# Patient Record
Sex: Male | Born: 1980 | Race: White | Hispanic: No | Marital: Single | Smoking: Never smoker
Health system: Southern US, Community
[De-identification: ages and names within clinical notes are randomized; demographics above are authoritative.]

---

## 2016-02-06 ENCOUNTER — Encounter: Payer: Self-pay | Admitting: Emergency Medicine

## 2016-02-06 ENCOUNTER — Ambulatory Visit
Admission: EM | Admit: 2016-02-06 | Discharge: 2016-02-06 | Disposition: A | Discharge status: Home / Self Care | Payer: BLUE CROSS/BLUE SHIELD | Attending: Family Medicine | Admitting: Family Medicine

## 2016-02-06 DIAGNOSIS — Z23 Encounter for immunization: Secondary | ICD-10-CM

## 2016-02-06 DIAGNOSIS — J0110 Acute frontal sinusitis, unspecified: Secondary | ICD-10-CM | POA: Diagnosis not present

## 2016-02-06 DIAGNOSIS — J40 Bronchitis, not specified as acute or chronic: Secondary | ICD-10-CM

## 2016-02-06 DIAGNOSIS — J0100 Acute maxillary sinusitis, unspecified: Secondary | ICD-10-CM

## 2016-02-06 LAB — RAPID STREP SCREEN (MED CTR MEBANE ONLY): Streptococcus, Group A Screen (Direct): NEGATIVE

## 2016-02-06 MED ORDER — TETANUS-DIPHTH-ACELL PERTUSSIS 5-2.5-18.5 LF-MCG/0.5 IM SUSP
0.5000 mL | Freq: Once | INTRAMUSCULAR | Status: AC
  Administered 2016-02-06: 0.5 mL via INTRAMUSCULAR

## 2016-02-06 MED ORDER — AMOXICILLIN-POT CLAVULANATE 875-125 MG PO TABS: 1.0000 | ORAL_TABLET | Freq: Two times a day (BID) | ORAL | Status: DC

## 2016-02-06 MED ORDER — BENZONATATE 100 MG PO CAPS: 100.0000 mg | ORAL_CAPSULE | Freq: Three times a day (TID) | ORAL | Status: DC | PRN

## 2016-02-06 MED ORDER — GUAIFENESIN-CODEINE 100-10 MG/5ML PO SOLN: 5.0000 mL | Freq: Three times a day (TID) | ORAL | Status: DC | PRN

## 2016-02-06 NOTE — ED Provider Notes (Signed)
[facility]  ____________________________________________  Time seen: Approximately 1:22 PM  I have reviewed the triage vital signs and the nursing notes.   HISTORY  Chief Complaint Cough  HPI Mayfield Schoene is a 35 y.o. male presents for complaint of 2 weeks of runny nose, nasal congestion, sinus pressure and sinus drainage. Reports cough is primarily associated with postnasal drainage at night. Reports occasional cough throughout the day. Patient reports that nasal drainage is primarily thick green or yellow coloration. States current sinus discomfort is 5 out of 10 aching and pressure. Denies pain radiation. Reports symptoms have resolved with over-the-counter cough and congestion medications.  Denies chest pain, shortness of breath, abdominal pain, neck or back pain, rash, dizziness or weakness.    No past medical history on file.  There are no active problems to display for this patient.   No past surgical history on file.  No current outpatient prescriptions on file.  Allergies Review of patient's allergies indicates no known allergies.  History reviewed. No pertinent family history.  Social History Social History  Substance Use Topics  . Smoking status: Never Smoker   . Smokeless tobacco: None  . Alcohol Use: No    Review of Systems Constitutional: No fever/chills Eyes: No visual changes. ENT: No sore throat. Positive cough, congestion, runny nose.  Cardiovascular: Denies chest pain. Respiratory: Denies shortness of breath. Gastrointestinal: No abdominal pain.  No nausea, no vomiting.  No diarrhea.  No constipation. Genitourinary: Negative for dysuria. Musculoskeletal: Negative for back pain. Skin: Negative for rash. Neurological: Negative for headaches, focal weakness or numbness.  10-point ROS otherwise negative.  ____________________________________________   PHYSICAL EXAM:  VITAL SIGNS: ED Triage Vitals  Enc Vitals Group     BP  02/06/16 1256 124/73 mmHg     Pulse Rate 02/06/16 1255 67     Resp 02/06/16 1255 16     Temp 02/06/16 1255 98.2 F (36.8 C)     Temp src -- oral     SpO2 02/06/16 1255 99 %     Weight 02/06/16 1255 219 lb (99.338 kg)     Height 02/06/16 1255  (1.778 m)     Head Cir --      Peak Flow --      Pain Score 02/06/16 1257 2     Pain Loc --      Pain Edu? --      Excl. in [place]? --   Constitutional: Alert and oriented. Well appearing and in no acute distress. Eyes: Conjunctivae are normal. PERRL. EOMI. Head: Atraumatic.Mild to moderate tenderness to palpation bilateral frontal and maxillary sinuses. No swelling. No erythema.   Ears: no erythema, normal TMs bilaterally.   Nose: nasal congestion with bilateral nasal turbinate erythema and edema.   Mouth/Throat: Mucous membranes are moist.  Oropharynx non-erythematous.No tonsillar swelling or exudate.  Neck: No stridor.  No cervical spine tenderness to palpation. Hematological/Lymphatic/Immunilogical: No cervical lymphadenopathy. Cardiovascular: Normal rate, regular rhythm. Grossly normal heart sounds.  Good peripheral circulation. Respiratory: Normal respiratory effort.  No retractions. Lungs CTAB. No wheezes, rales or rhonchi. Good air movement.  Gastrointestinal: Soft and nontender. No distention. Normal Bowel sounds. No CVA tenderness. Musculoskeletal: No lower or upper extremity tenderness nor edema.  Bilateral pedal pulses equal and easily palpated. No cervical, thoracic or lumbar tenderness to palpation.  Neurologic:  Normal speech and language. No gross focal neurologic deficits are appreciated. No gait instability. Skin:  Skin is warm, dry and intact. No rash noted. Psychiatric: Mood  and affect are normal. Speech and behavior are normal.  ____________________________________________   LABS (all labs ordered are listed, but only abnormal results are displayed)  Labs Reviewed  RAPID STREP SCREEN (NOT AT [facility])  CULTURE, GROUP A  STREP [facility])     INITIAL IMPRESSION / ASSESSMENT AND PLAN / ED COURSE  Pertinent labs & imaging results that were available during my care of the patient were reviewed by me and considered in my medical decision making (see chart for details).  Very well-appearing patient. No acute distress. Presents for complaints of 2 weeks of runny nose, nasal congestion, sinus pressure sinus drainage and cough. Lungs clear throughout. Abdomen soft and nontender. Moist mucous membranes. Very well-appearing patient. Suspect sinusitis. Will treat patient with oral Augmentin, and when necessary Tessalon Perles during the day as well as. Guaifenesin with codeine at night as needed for cough. Rest, fluids, over-the-counter Tylenol or ibuprofen as needed.  Patient also states that he is expecting a newborn in early April and states that he was told that he needed to have his Tdap updated. Patient requests Tdap  immunization while in urgent care today. Tdap updated.  Discussed follow up with Primary care physician this week. Discussed follow up and return parameters including no resolution or any worsening concerns. Patient verbalized understanding and agreed to plan.   ____________________________________________   FINAL CLINICAL IMPRESSION(S) / ED DIAGNOSES  Final diagnoses:  Acute maxillary sinusitis, recurrence not specified  Acute frontal sinusitis, recurrence not specified  Bronchitis  Need for Tdap vaccination      Note: This dictation was prepared with Dragon dictation along with smaller phrase technology. Any transcriptional errors that result from this process are unintentional.   Renford Dills, NP 02/06/16 1439  Renford Dills, NP 02/06/16 1440

## 2016-02-06 NOTE — ED Notes (Signed)
Pt reports cough, congestion, and sore throat ongoing over last 2 weeks denies known fever.

## 2016-02-06 NOTE — Discharge Instructions (Signed)
Take medication as prescribed. Rest. Drink plenty of fluids.  ° °Follow up with your primary care physician this week as needed. Return to Urgent care for new or worsening concerns.  ° ° °Sinusitis, Adult °Sinusitis is redness, soreness, and inflammation of the paranasal sinuses. Paranasal sinuses are air pockets within the bones of your face. They are located beneath your eyes, in the middle of your forehead, and above your eyes. In healthy paranasal sinuses, mucus is able to drain out, and air is able to circulate through them by way of your nose. However, when your paranasal sinuses are inflamed, mucus and air can become trapped. This can allow bacteria and other germs to grow and cause infection. °Sinusitis can develop quickly and last only a short time (acute) or continue over a long period (chronic). Sinusitis that lasts for more than 12 weeks is considered chronic. °CAUSES °Causes of sinusitis include: °· Allergies. °· Structural abnormalities, such as displacement of the cartilage that separates your nostrils (deviated septum), which can decrease the air flow through your nose and sinuses and affect sinus drainage. °· Functional abnormalities, such as when the small hairs (cilia) that line your sinuses and help remove mucus do not work properly or are not present. °SIGNS AND SYMPTOMS °Symptoms of acute and chronic sinusitis are the same. The primary symptoms are pain and pressure around the affected sinuses. Other symptoms include: °· Upper toothache. °· Earache. °· Headache. °· Bad breath. °· Decreased sense of smell and taste. °· A cough, which worsens when you are lying flat. °· Fatigue. °· Fever. °· Thick drainage from your nose, which often is green and may contain pus (purulent). °· Swelling and warmth over the affected sinuses. °DIAGNOSIS °Your health care provider will perform a physical exam. During your exam, your health care provider may perform any of the following to help determine if you have  acute sinusitis or chronic sinusitis: °· Look in your nose for signs of abnormal growths in your nostrils (nasal polyps). °· Tap over the affected sinus to check for signs of infection. °· View the inside of your sinuses using an imaging device that has a light attached (endoscope). °If your health care provider suspects that you have chronic sinusitis, one or more of the following tests may be recommended: °· Allergy tests. °· Nasal culture. A sample of mucus is taken from your nose, sent to a lab, and screened for bacteria. °· Nasal cytology. A sample of mucus is taken from your nose and examined by your health care provider to determine if your sinusitis is related to an allergy. °TREATMENT °Most cases of acute sinusitis are related to a viral infection and will resolve on their own within 10 days. Sometimes, medicines are prescribed to help relieve symptoms of both acute and chronic sinusitis. These may include pain medicines, decongestants, nasal steroid sprays, or saline sprays. °However, for sinusitis related to a bacterial infection, your health care provider will prescribe antibiotic medicines. These are medicines that will help kill the bacteria causing the infection. °Rarely, sinusitis is caused by a fungal infection. In these cases, your health care provider will prescribe antifungal medicine. °For some cases of chronic sinusitis, surgery is needed. Generally, these are cases in which sinusitis recurs more than 3 times per year, despite other treatments. °HOME CARE INSTRUCTIONS °· Drink plenty of water. Water helps thin the mucus so your sinuses can drain more easily. °· Use a humidifier. °· Inhale steam 3-4 times a day (for example, sit in   the bathroom with the shower running). °· Apply a warm, moist washcloth to your face 3-4 times a day, or as directed by your health care provider. °· Use saline nasal sprays to help moisten and clean your sinuses. °· Take medicines only as directed by your health care  provider. °· If you were prescribed either an antibiotic or antifungal medicine, finish it all even if you start to feel better. °SEEK IMMEDIATE MEDICAL CARE IF: °· You have increasing pain or severe headaches. °· You have nausea, vomiting, or drowsiness. °· You have swelling around your face. °· You have vision problems. °· You have a stiff neck. °· You have difficulty breathing. °  °This information is not intended to replace advice given to you by your health care provider. Make sure you discuss any questions you have with your health care provider. °  °Document Released: 12/01/2005 Document Revised: 12/22/2014 Document Reviewed: 12/16/2011 °Elsevier Interactive Patient Education ©2016 [employer] ° °

## 2016-02-09 LAB — CULTURE, GROUP A STREP (THRC)

## 2019-01-24 ENCOUNTER — Ambulatory Visit
Admission: EM | Admit: 2019-01-24 | Discharge: 2019-01-24 | Disposition: A | Discharge status: Home / Self Care | Payer: Medicaid Other | Attending: Emergency Medicine | Admitting: Emergency Medicine

## 2019-01-24 ENCOUNTER — Ambulatory Visit: Payer: Medicaid Other

## 2019-01-24 ENCOUNTER — Encounter: Payer: Self-pay | Admitting: Emergency Medicine

## 2019-01-24 ENCOUNTER — Other Ambulatory Visit: Payer: Self-pay

## 2019-01-24 DIAGNOSIS — S39012A Strain of muscle, fascia and tendon of lower back, initial encounter: Secondary | ICD-10-CM | POA: Diagnosis present

## 2019-01-24 DIAGNOSIS — X501XXA Overexertion from prolonged static or awkward postures, initial encounter: Secondary | ICD-10-CM | POA: Diagnosis not present

## 2019-01-24 MED ORDER — KETOROLAC TROMETHAMINE 60 MG/2ML IM SOLN
30.0000 mg | Freq: Once | INTRAMUSCULAR | Status: AC
  Administered 2019-01-24: 30 mg via INTRAMUSCULAR

## 2019-01-24 MED ORDER — TIZANIDINE HCL 4 MG PO TABS: 4.0000 mg | ORAL_TABLET | Freq: Three times a day (TID) | ORAL | 0 refills | Status: DC | PRN

## 2019-01-24 MED ORDER — METHYLPREDNISOLONE SODIUM SUCC 125 MG IJ SOLR
125.0000 mg | Freq: Once | INTRAMUSCULAR | Status: AC
  Administered 2019-01-24: 125 mg via INTRAMUSCULAR

## 2019-01-24 MED ORDER — IBUPROFEN 600 MG PO TABS: 600.0000 mg | ORAL_TABLET | Freq: Four times a day (QID) | ORAL | 0 refills | Status: DC | PRN

## 2019-01-24 MED ORDER — HYDROCODONE-ACETAMINOPHEN 5-325 MG PO TABS: 1.0000 | ORAL_TABLET | Freq: Four times a day (QID) | ORAL | 0 refills | Status: DC | PRN

## 2019-01-24 NOTE — ED Triage Notes (Signed)
Patient c/o pulling his back out today when trying to pick up his son about 2 hours ago.

## 2019-01-24 NOTE — Discharge Instructions (Addendum)
600 mg of ibuprofen combined with a Tylenol containing product 3 or 4 times a day.  Do this on a regular basis for the next several days.  Thousand milligrams of Tylenol/600 mg ibuprofen mild to moderate pain or 1-2 Norco/ibuprofen for severe pain.  Do not take Tylenol and Norco other, as they both have Tylenol in them and too much Tylenol can hurt your liver.  Many people find gentle stretching and deep tissue massage helpful. Early mobility is very important help you recover quickly. Zanaflex will help relax the muscles.  Follow-up with your primary care physician in several days, go to the ER for the signs and symptoms we discussed.  Do not take Ambien if you are taking Norco.  Go to www.goodrx.com to look up your medications. This will give you a list of where you can find your prescriptions at the most affordable prices. Or ask the pharmacist what the cash price is, or if they have any other discount programs available to help make your medication more affordable. This can be less expensive than what you would pay with insurance.

## 2019-01-24 NOTE — ED Provider Notes (Signed)
HPI  SUBJECTIVE:  Kevin Larson is a 38 y.o. male who presents with the acute onset of midline/left-sided low back pain while trying to bend forward put down his toddler.  States that he felt a pop, and "threw out his back".  This occurred 2 to 3 hours prior to arrival.  He describes the pain as constant, alternating dull and sharp.  He denies vomiting, fevers, numbness or tingling radiating down his legs, leg weakness, urinary fecal incontinence, urinary retention, saddle anesthesia, flank or abdominal pain.  States this feels similar to previous back injury.  He tried 1000 mg of Tylenol without improvement in his symptoms.  Symptoms are worse with bending forward.  He has a past medical history of back injury.  No history of cancer, multiple myeloma, IVDU, HIV, aortic abdominal aneurysm, prolonged steroid use, osteopenia. PMD: Dr. Clide Deutscher   History reviewed. No pertinent past medical history.  History reviewed. No pertinent surgical history.  History reviewed. No pertinent family history.  Social History   Tobacco Use  . Smoking status: Never Smoker  . Smokeless tobacco: Never Used  Substance Use Topics  . Alcohol use: No  . Drug use: Not on file    No current facility-administered medications for this encounter.   Current Outpatient Medications:  .  traZODone (DESYREL) 50 MG tablet, Take by mouth., Disp: , Rfl:  .  HYDROcodone-acetaminophen (NORCO/VICODIN) 5-325 MG tablet, Take 1-2 tablets by mouth every 6 (six) hours as needed for moderate pain or severe pain., Disp: 12 tablet, Rfl: 0 .  ibuprofen (ADVIL,MOTRIN) 600 MG tablet, Take 1 tablet (600 mg total) by mouth every 6 (six) hours as needed., Disp: 30 tablet, Rfl: 0 .  tiZANidine (ZANAFLEX) 4 MG tablet, Take 1 tablet (4 mg total) by mouth every 8 (eight) hours as needed for muscle spasms., Disp: 30 tablet, Rfl: 0  Allergies  Allergen Reactions  . Bupropion Other (See Comments)     ROS  As noted in HPI.   Physical  Exam  BP 133/81 (BP Location: Right Arm)   Pulse 62   Temp 97.8 F (36.6 C) (Oral)   Resp 18   Ht '5\' 10"'  (1.778 m)   Wt 101.6 kg   SpO2 100%   BMI 32.14 kg/m   Constitutional: Well developed, well nourished, no acute distress Eyes:  EOMI, conjunctiva normal bilaterally HENT: Normocephalic, atraumatic,mucus membranes moist Respiratory: Normal inspiratory effort Cardiovascular: Normal rate GI: nondistended. No suprapubic tenderness skin: No rash, skin intact Musculoskeletal: no CVAT. - paralumbar tenderness, -appreciable muscle spasm. No bony tenderness over the L-spine, though he indicates L5/S1 as area of pain, SI joint. Bilateral lower extremities nontender, baseline ROM with intact DP pulses. No pain with int/ext rotation extension hips bilaterally.  Pain aggravated with hip flexion against resistance on the right more so than the left.  SLR neg bilaterally. Sensation baseline light touch bilaterally for Pt, DTR's symmetric and intact bilaterally KJ, Motor symmetric bilateral 5/5 hip flexion, quadriceps, hamstrings, EHL, foot dorsiflexion, foot plantarflexion, gait somewhat antalgic but without apparent new ataxia. Neurologic: Alert & oriented x 3, no focal neuro deficits Psychiatric: Speech and behavior appropriate   ED Course   Medications  ketorolac (TORADOL) injection 30 mg (30 mg Intramuscular Given 01/24/19 1300)  methylPREDNISolone sodium succinate (SOLU-MEDROL) 125 mg/2 mL injection 125 mg (125 mg Intramuscular Given 01/24/19 1300)    Orders Placed This Encounter  Procedures  . DG Lumbar Spine Complete    Standing Status:   Standing  Number of Occurrences:   1    Order Specific Question:   Reason for Exam (SYMPTOM  OR DIAGNOSIS REQUIRED)    Answer:   midline back pain r/o acute changes    No results found for this or any previous visit (from the past 24 hour(s)). Dg Lumbar Spine Complete  Result Date: 01/24/2019 CLINICAL DATA:  Lumbago EXAM: LUMBAR SPINE -  COMPLETE 4+ VIEW COMPARISON:  None. FINDINGS: Frontal, lateral, spot lumbosacral lateral, and bilateral oblique views were obtained. There are 5 non-rib-bearing lumbar type vertebral bodies. There is no fracture or spondylolisthesis. There is moderately severe disc space narrowing at L5-S1. Other disc spaces appear unremarkable. There is no appreciable facet arthropathy. IMPRESSION: Disc space narrowing at L5-S1. Other disc spaces appear unremarkable. No appreciable facet arthropathy. No fracture or spondylolisthesis. Electronically Signed   By: Lowella Grip III M.D.   On: 01/24/2019 13:21    ED Clinical Impression  Strain of lumbar region, initial encounter   ED Assessment/Plan  [place] Narcotic database reviewed for this patient, and feel that the risk/benefit ratio today is favorable for proceeding with a prescription for controlled substance. Pt with no opiate rx since in 2 years.  Was prescribed Ambien on 1/27.Marland Kitchen  Patient with acute back sprain/strain.  Will check an L-spine.  Giving Solu-Medrol 125 mg IM and Toradol 30 mg IM.  Plan to send home with ibuprofen 600 mg combined with 1 g of Tylenol, or ibuprofen 600 mg combined with 1-2 Norco 3 or 4 times a day.  Zanaflex, rehab exercises, encourage deep tissue massage.  No Ambien if taking Norco.  Follow-up with PMD or ortho spine/neurosurgery PRN, to the ER if he gets worse.  Reviewed imaging independently.  Moderately severe disc space narrowing at L5-S1.  Other disc spaces unremarkable.  No appreciable facet arthropathy.  See radiology report for full details.   Pt ambulatory in the UC.  Reevaluation, states that he feels significantly better after the medications.  Providing copy of the etiology report.  Discussed imaging, medical decision-making, and plan for follow-up with the patient.  Discussed signs and symptoms that should prompt return to the emergency department.  Patient agrees with plan.  Meds ordered this encounter  Medications   . ketorolac (TORADOL) injection 30 mg  . methylPREDNISolone sodium succinate (SOLU-MEDROL) 125 mg/2 mL injection 125 mg  . HYDROcodone-acetaminophen (NORCO/VICODIN) 5-325 MG tablet    Sig: Take 1-2 tablets by mouth every 6 (six) hours as needed for moderate pain or severe pain.    Dispense:  12 tablet    Refill:  0  . ibuprofen (ADVIL,MOTRIN) 600 MG tablet    Sig: Take 1 tablet (600 mg total) by mouth every 6 (six) hours as needed.    Dispense:  30 tablet    Refill:  0  . tiZANidine (ZANAFLEX) 4 MG tablet    Sig: Take 1 tablet (4 mg total) by mouth every 8 (eight) hours as needed for muscle spasms.    Dispense:  30 tablet    Refill:  0    *This clinic note was created using [employer]. Therefore, there may be occasional mistakes despite careful proofreading.  ?     Melynda Ripple, MD 01/24/19 1359

## 2019-05-22 ENCOUNTER — Ambulatory Visit
Admission: EM | Admit: 2019-05-22 | Discharge: 2019-05-22 | Disposition: A | Discharge status: Home / Self Care | Payer: Medicaid Other | Attending: Family Medicine | Admitting: Family Medicine

## 2019-05-22 ENCOUNTER — Other Ambulatory Visit: Payer: Self-pay

## 2019-05-22 ENCOUNTER — Encounter: Payer: Self-pay | Admitting: Emergency Medicine

## 2019-05-22 DIAGNOSIS — M545 Low back pain, unspecified: Secondary | ICD-10-CM

## 2019-05-22 MED ORDER — KETOROLAC TROMETHAMINE 10 MG PO TABS: 10.0000 mg | ORAL_TABLET | Freq: Four times a day (QID) | ORAL | 0 refills | Status: DC | PRN

## 2019-05-22 MED ORDER — TIZANIDINE HCL 4 MG PO TABS: 4.0000 mg | ORAL_TABLET | Freq: Four times a day (QID) | ORAL | 0 refills | Status: DC | PRN

## 2019-05-22 NOTE — ED Provider Notes (Signed)
[facility]    CSN: [id number] Arrival date & time: 05/22/19  1234  History   Chief Complaint Chief Complaint  Patient presents with  . Back Pain   HPI  38 year old male presents with cough.  Patient also has other concerns today.  Patient reports that he woke up this morning and got out of bed and developed sudden onset midline low back pain.  He has had back pain before.  Patient his pain is severe, 8/10 in severity worse with activity.  No relieving factors.  Additionally, patient states that he would like to discuss starting Cialis.  He also reports that he had a cough last month and wants to be thoroughly examined.  Additionally, patient states that he has some moles on his back and would like them examined today as well.  No fever.  No other associated symptoms.  No other complaints.  History reviewed as below. PMH: Insomnia, Anxiety  Home Medications    Prior to Admission medications   Medication Sig Start Date End Date Taking? Authorizing Provider  ketorolac (TORADOL) 10 MG tablet Take 1 tablet (10 mg total) by mouth every 6 (six) hours as needed for moderate pain or severe pain. 05/22/19   Coral Spikes, DO  tiZANidine (ZANAFLEX) 4 MG tablet Take 1 tablet (4 mg total) by mouth every 6 (six) hours as needed for muscle spasms. 05/22/19   Coral Spikes, DO   Social History Social History   Tobacco Use  . Smoking status: Never Smoker  . Smokeless tobacco: Never Used  Substance Use Topics  . Alcohol use: No  . Drug use: Never     Allergies   Bupropion   Review of Systems Review of Systems  Musculoskeletal: Positive for back pain.  Skin:       Moles.   Physical Exam Triage Vital Signs ED Triage Vitals [05/22/19 1253]  Enc Vitals Group     BP 126/75     Pulse Rate 72     Resp 18     Temp 98.3 F (36.8 C)     Temp Source Oral     SpO2 100 %     Weight 220 lb (99.8 kg)     Height 5\' 10"  (1.778 m)     Head Circumference      Peak Flow      Pain  Score 8     Pain Loc      Pain Edu?      Excl. in [place]?    Updated Vital Signs BP 126/75 (BP Location: Right Arm)   Pulse 72   Temp 98.3 F (36.8 C) (Oral)   Resp 18   Ht 5\' 10"  (1.778 m)   Wt 99.8 kg   SpO2 100%   BMI 31.57 kg/m   Visual Acuity Right Eye Distance:   Left Eye Distance:   Bilateral Distance:    Right Eye Near:   Left Eye Near:    Bilateral Near:     Physical Exam Vitals signs and nursing note reviewed.  Constitutional:      General: He is not in acute distress.    Appearance: Normal appearance.  HENT:     Head: Normocephalic and atraumatic.  Eyes:     General:        Right eye: No discharge.        Left eye: No discharge.     Conjunctiva/sclera: Conjunctivae normal.  Cardiovascular:     Rate and Rhythm: Normal  rate and regular rhythm.  Pulmonary:     Effort: Pulmonary effort is normal.     Breath sounds: Normal breath sounds. No wheezing, rhonchi or rales.  Musculoskeletal:     Comments: Lumbar spine with spasm noted.  Mild tenderness in the midline.  Skin:    Comments: Patient has multiple nevi on his back.  All appear symmetrical with no evidence of features concerning for underlying malignancy.  Neurological:     Mental Status: He is alert.  Psychiatric:        Mood and Affect: Mood normal.        Behavior: Behavior normal.    UC Treatments / Results  Labs (all labs ordered are listed, but only abnormal results are displayed) Labs Reviewed - No data to display  EKG None  Radiology No results found.  Procedures Procedures (including critical care time)  Medications Ordered in UC Medications - No data to display  Initial Impression / Assessment and Plan / UC Course  I have reviewed the triage vital signs and the nursing notes.  Pertinent labs & imaging results that were available during my care of the patient were reviewed by me and considered in my medical decision making (see chart for details).    38 year old male presents  with acute low back pain.  Treating with Toradol and Zanaflex.    Regards to his other concerns - No evidence of skin cancer at this time.  I do not feel that he warrants erectile dysfunction medication nor is it is an issue for urgent care.    Final Clinical Impressions(s) / UC Diagnoses   Final diagnoses:  Acute midline low back pain without sciatica     Discharge Instructions     Rest.  Medication as prescribed.  Please see your PCP regarding your other concerns.  Take care  Dr. Adriana Simasook    ED Prescriptions    Medication Sig Dispense Auth. Provider   ketorolac (TORADOL) 10 MG tablet Take 1 tablet (10 mg total) by mouth every 6 (six) hours as needed for moderate pain or severe pain. 20 tablet Taiwo Fish G, DO   tiZANidine (ZANAFLEX) 4 MG tablet Take 1 tablet (4 mg total) by mouth every 6 (six) hours as needed for muscle spasms. 30 tablet Tommie Samsook, Chelly Dombeck G, DO     Controlled Substance Prescriptions [place] Controlled Substance Registry consulted? Not Applicable   Tommie SamsCook, Zaevion Parke G, DO 05/22/19 1559

## 2019-05-22 NOTE — ED Triage Notes (Signed)
Patient stated that when he woke up this morning he started having low back pain. He states the same thing happened in February and he was given Toradol and Solumedrol injection and a prescription for Hydrocodone, Ibuprofen and Zanaflex. He states these medications helped him.

## 2019-05-22 NOTE — Discharge Instructions (Signed)
Rest.  Medication as prescribed.  Please see your PCP regarding your other concerns.  Take care  Dr. Lacinda Axon

## 2019-08-12 IMAGING — CR DG LUMBAR SPINE COMPLETE 4+V
5 series · 5 of 5 positions shown · non-contrast
Comparison: None.

CLINICAL DATA: Lumbago

EXAM:
LUMBAR SPINE - COMPLETE 4+ VIEW

[l-spine ap]
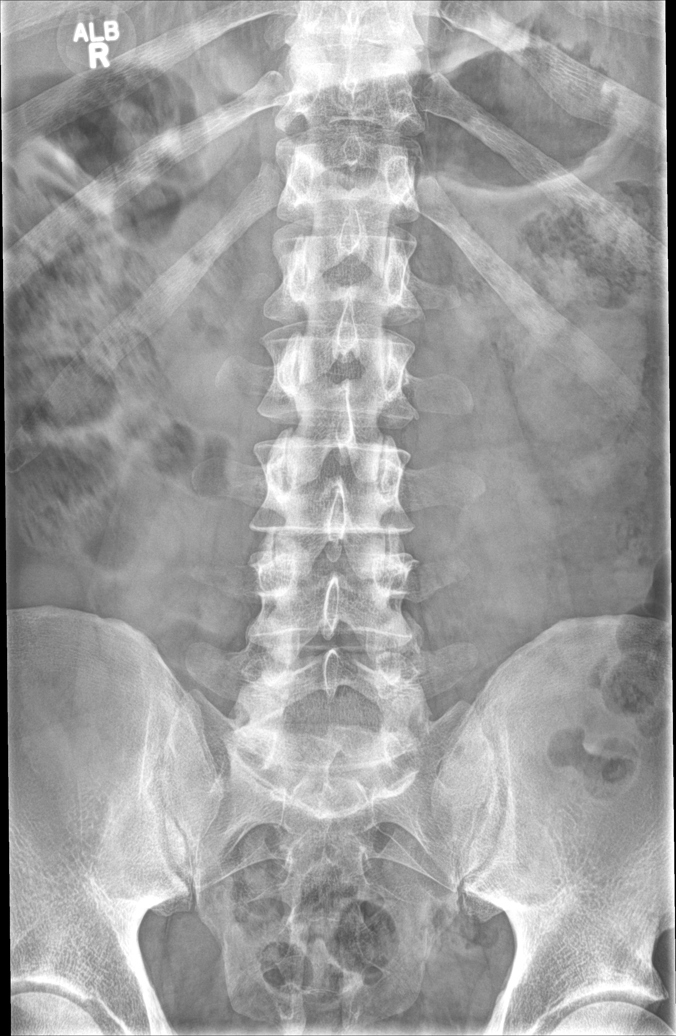

[l-spine obl (1 of 2)]
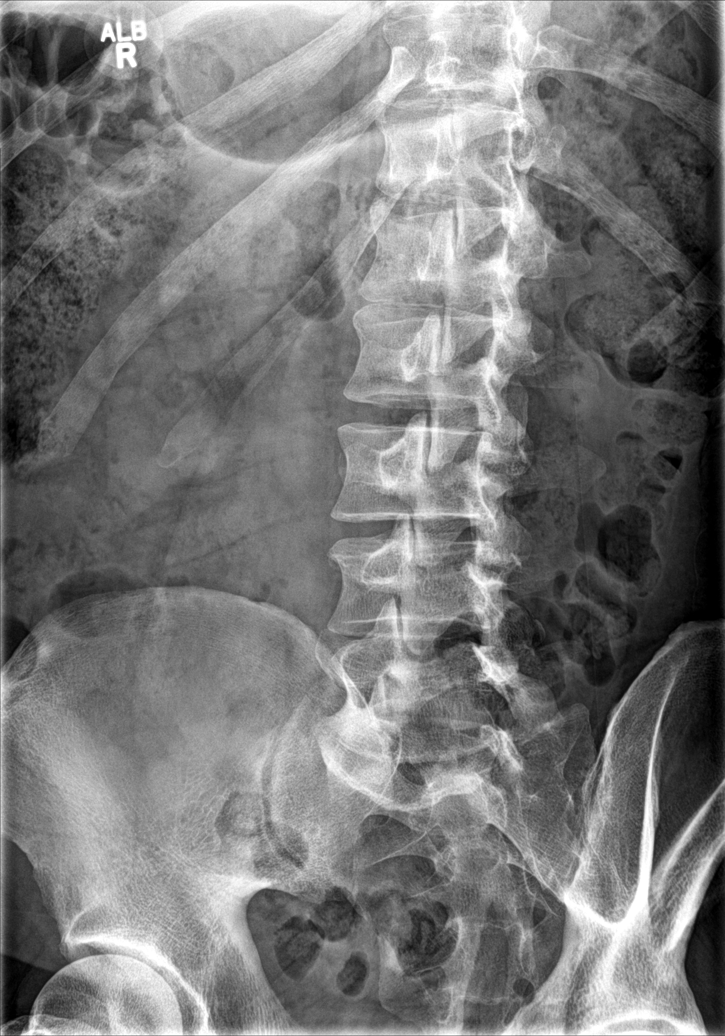

[l-spine obl (2 of 2)]
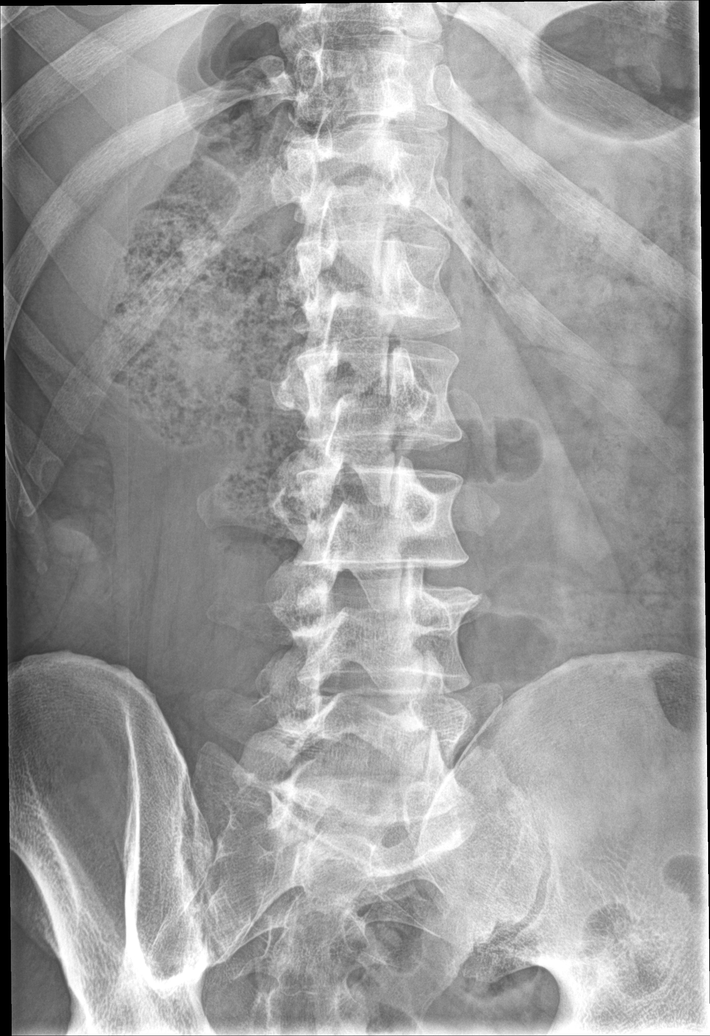

[l-spine lat]
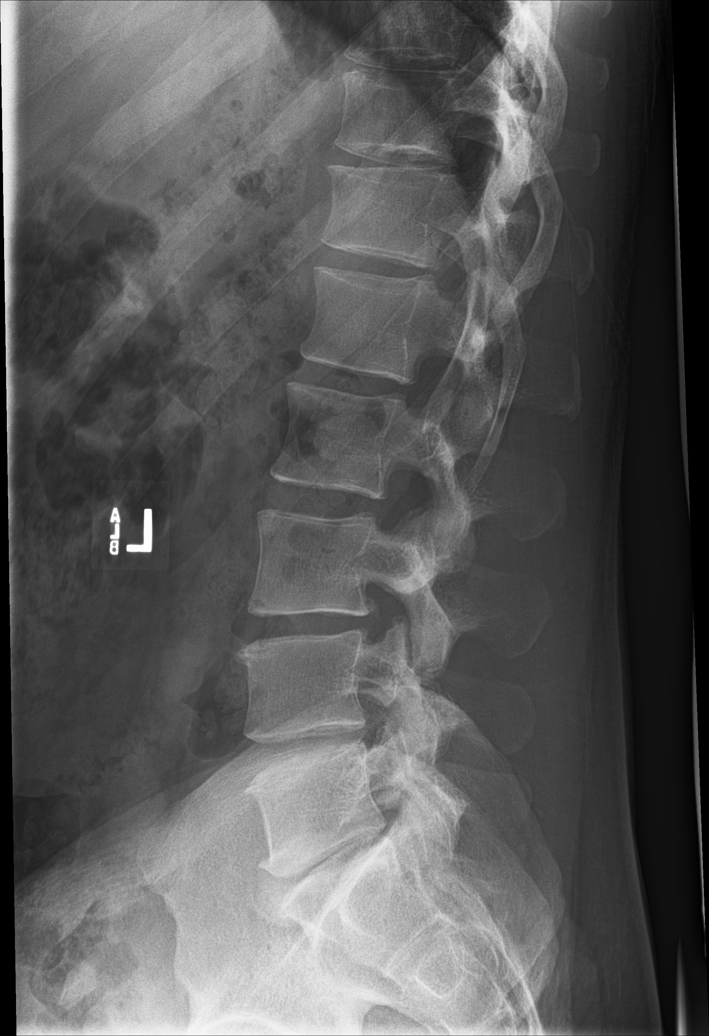

[l-spine spot]
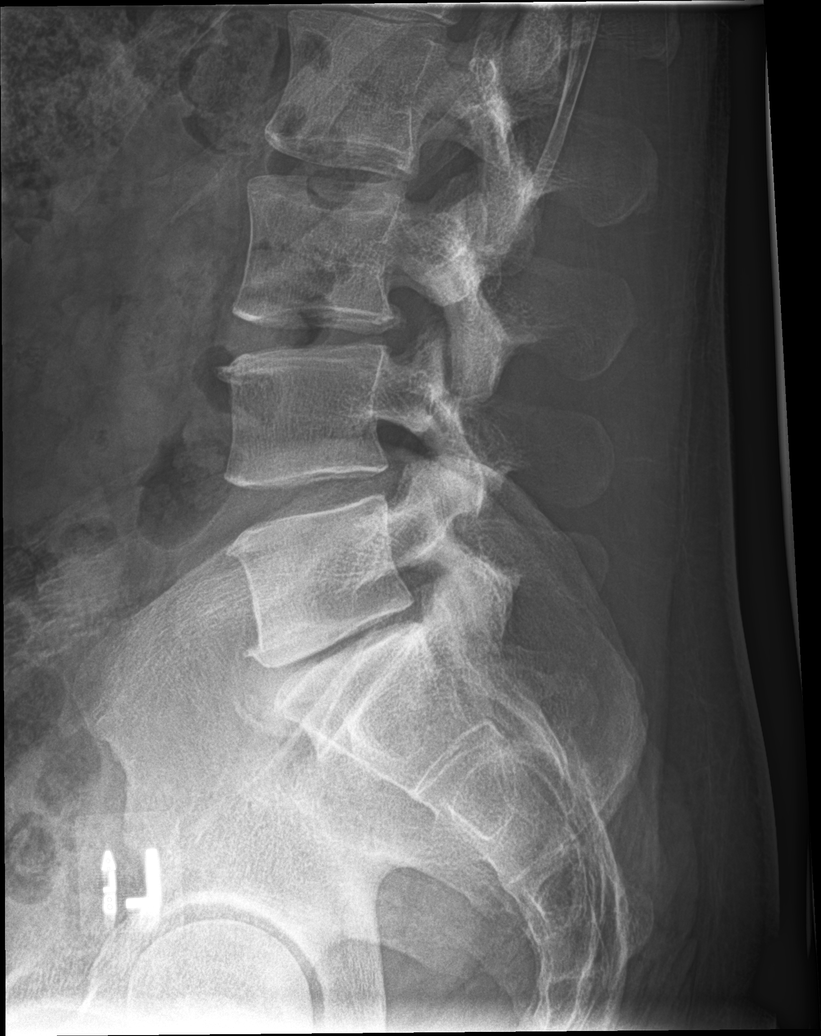

[5 of 5 positions shown; findings below may reference images not displayed]

FINDINGS: Frontal, lateral, spot lumbosacral lateral, and bilateral oblique
views were obtained. There are 5 non-rib-bearing lumbar type
vertebral bodies. There is no fracture or spondylolisthesis. There
is moderately severe disc space narrowing at L5-S1. Other disc
spaces appear unremarkable. There is no appreciable facet
arthropathy.
IMPRESSION: Disc space narrowing at L5-S1. Other disc spaces appear
unremarkable. No appreciable facet arthropathy. No fracture or
spondylolisthesis.

## 2020-06-25 ENCOUNTER — Encounter: Payer: Self-pay | Admitting: Physical Therapy

## 2020-06-25 ENCOUNTER — Ambulatory Visit: Payer: Medicaid Other | Attending: Family Medicine | Admitting: Physical Therapy

## 2020-06-25 ENCOUNTER — Other Ambulatory Visit: Payer: Self-pay

## 2020-06-25 DIAGNOSIS — M6281 Muscle weakness (generalized): Secondary | ICD-10-CM

## 2020-06-25 DIAGNOSIS — G8929 Other chronic pain: Secondary | ICD-10-CM | POA: Insufficient documentation

## 2020-06-25 DIAGNOSIS — M545 Low back pain: Secondary | ICD-10-CM | POA: Diagnosis not present

## 2020-06-25 NOTE — Therapy (Signed)
[facility] [address] [place], [place], [address] Phone: [phone]   Fax:  [phone]  Physical Therapy Evaluation  Patient Details  Name: Kevin Larson MRN: [id number] Date of Birth: 1981/11/12 Referring Provider (PT): Karie Fetch, MD   Encounter Date: 06/25/2020   PT End of Session - 06/25/20 1328    Visit Number 1    Number of Visits 9    Date for PT Re-Evaluation 07/23/20    PT Start Time 0817    PT Stop Time 0910    PT Time Calculation (min) 53 min    Activity Tolerance Patient tolerated treatment well;No increased pain    Behavior During Therapy [facility] for tasks assessed/performed           History reviewed. No pertinent past medical history.  History reviewed. No pertinent surgical history.  There were no vitals filed for this visit.    Subjective Assessment - 06/25/20 1113    Subjective Pt. is a 39 y.o. male with chronic low back pain. Pt. describes pain as intermittent with it "going from 0-60 very fast" and then returning to no pain. Pt. describes the pain as sharp, more on L than R, but painful on both. Pain occasionally reaches 8-9/10, and he has seen urgent care/ED for pain previously. Pt. recalls first experiencing the pain 10 years ago after bowling. Recently, pain is most closely related to weight lifting, prolonged standing (more than 1.5 hours causes pain), or long sitting on the floor to play with his kids. Pt. recently decreased weightlifting to 1x/week about a month ago due to back pain. Pt. has undergone recent weightloss and is working with a Scientific laboratory technician.    Pertinent History 2 boys: 40 yo and 40 yo    Limitations Standing    How long can you stand comfortably? 1.5 hours    Patient Stated Goals decrease episodes of back pain    Currently in Pain? No/denies              [facility] PT Assessment - 06/25/20 0001      Assessment   Medical Diagnosis back pain    Referring Provider (PT) Karie Fetch, MD     Onset Date/Surgical Date 12/16/19      Home Environment   Living Environment Private residence      Prior Function   Level of Independence Independent           Strength: bilat LE 5/5 strength. TrA strength decreased, pt. experienced difficulty with recruitment.   Sensation: intact bilat  Flexibility: bilat hamstrings decreased flexibility, some concordant back pain noted  Passive vertebral motion: WFL, some discomfort noted L3-L5  AROM: lumbar AROM WFL, no pain noted  Palpation: no leg length discrepancy noted, pelvis appears level. Large trigger point noted in R paraspinals. L paraspinals normal     Objective measurements completed on examination: See above findings.        PT Long Term Goals - 06/25/20 1404      PT LONG TERM GOAL #1   Title Pt. will incresae FOTO score to 55 to improve pain free mobility.    Baseline 06/25/20: 40    Time 4    Period Weeks    Status New    Target Date 07/23/20      PT LONG TERM GOAL #2   Title Pt. will report no increase in pain after weight lifting to improve pain free mobility.    Baseline Pt. typically feels pain  1 day after weight lifting.    Time 4    Period Weeks    Status New    Target Date 07/23/20      PT LONG TERM GOAL #3   Title Pt. will report no pain after 1.5 hours of standing to improve pain free mobility.    Baseline Pt. reports discomfort beginning after 1.5 hours of standing    Time 4    Period Weeks    Status New    Target Date 07/23/20              Plan - 06/25/20 1334    Clinical Impression Statement Pt. is a 39 y.o male with c/o of intermittent bilat low back pain. Pt. has 5/5 strength in bilat LE and demonstrates [facility] lumbar ROM with no increase in pain. Pt. demonstrates decreased bilat hamstring mobility with slight concordant back pain with stretching. Pt. noticed some discomfot with PA mobility from L3-L5, however mobility was [facility]. Pt. had a noticable trigger point in L lumbar paraspinals,  pressure on point caused similar pain. Pt.'s core strength was assessed and pt is unable to activate and maintain TrA activation without verbal or tactile cueing. Pt. completed FOTO and scored 40, target score is 55. Pt will benefit from skilled PT to improve strength and decrease occurances of back pain.    Stability/Clinical Decision Making Evolving/Moderate complexity    Clinical Decision Making Moderate    Rehab Potential Good    PT Frequency 2x / week    PT Duration 4 weeks    PT Treatment/Interventions ADLs/Self Care Home Management;Cryotherapy;Electrical Stimulation;Functional mobility training;Therapeutic activities;Therapeutic exercise;Neuromuscular re-education;Manual techniques;Patient/family education;Passive range of motion;Dry needling    PT Next Visit Plan core progression, discuss dry needling    PT Home Exercise Plan core progression    Consulted and Agree with Plan of Care Patient           Patient will benefit from skilled therapeutic intervention in order to improve the following deficits and impairments:  Decreased activity tolerance, Decreased strength, Decreased endurance, Increased muscle spasms, Impaired flexibility, Improper body mechanics, Increased fascial restricitons  Visit Diagnosis: Chronic bilateral low back pain without sciatica  Muscle weakness (generalized)     Problem List There are no problems to display for this patient.  Cammie Mcgee, PT, DPT # [id number] Sharyn Creamer, SPT 06/25/2020, 3:02 PM  [facility] [address] [place], [place], [address] Phone: [phone]   Fax:  [phone]  Name: Kevin Larson MRN: [id number] Date of Birth: 04-Aug-1981

## 2020-06-28 ENCOUNTER — Ambulatory Visit: Payer: Medicaid Other | Admitting: Physical Therapy

## 2020-07-02 ENCOUNTER — Encounter: Payer: Self-pay | Admitting: Physical Therapy

## 2020-07-02 ENCOUNTER — Encounter: Payer: Medicaid Other | Admitting: Physical Therapy

## 2020-07-02 ENCOUNTER — Other Ambulatory Visit: Payer: Self-pay

## 2020-07-02 ENCOUNTER — Ambulatory Visit: Payer: Medicaid Other | Admitting: Physical Therapy

## 2020-07-02 DIAGNOSIS — M6281 Muscle weakness (generalized): Secondary | ICD-10-CM

## 2020-07-02 DIAGNOSIS — M545 Low back pain, unspecified: Secondary | ICD-10-CM

## 2020-07-02 DIAGNOSIS — G8929 Other chronic pain: Secondary | ICD-10-CM

## 2020-07-02 NOTE — Therapy (Signed)
[facility] [address] [place], [place], [address] Phone: [phone]   Fax:  [phone]  Physical Therapy Treatment  Patient Details  Name: Kevin Larson MRN: [id number] Date of Birth: Mar 21, 1981 Referring Provider (PT): Karie Fetch, MD   Encounter Date: 07/02/2020   PT End of Session - 07/02/20 0832    Visit Number 2    Number of Visits 9    Date for PT Re-Evaluation 07/23/20    PT Start Time 0733    PT Stop Time 0832    PT Time Calculation (min) 59 min    Activity Tolerance Patient tolerated treatment well;No increased pain    Behavior During Therapy [facility] for tasks assessed/performed           History reviewed. No pertinent past medical history.  History reviewed. No pertinent surgical history.  There were no vitals filed for this visit.   Subjective Assessment - 07/02/20 0736    Subjective Pt. reports that he was confused on HEP, but still attempted to do planks. Pt. reports <1/10 on pain in back after lifting weights yesterday.    Pertinent History 2 boys: 40 yo and 40 yo    Limitations Standing    How long can you stand comfortably? 1.5 hours    Patient Stated Goals decrease episodes of back pain    Currently in Pain? Yes    Pain Score 1    <1/10   Pain Location Back             There Ex:  Nustep L4: 10 mins  TrA activation in supine: 25x  Workout form correction: bicep curls and leg press on total body  Manual:   STM to low back (R>L)/ trigger point assessment.    Dry needling to R L3-L5 lumbar paraspinals with two 0.3x64mm needles. Pt. was educated on benefits and risks to procedure. Pt. positioned in prone. No initial pain reported prior to needling, however tender concordant pain to palpation, when needled pt. felt concordant pain with referral to hip. Post treatment, pt. reported less pain to palpation and decreased referral pain in hip. Total time: 8 mins      PT Long Term Goals - 06/25/20  1404      PT LONG TERM GOAL #1   Title Pt. will incresae FOTO score to 55 to improve pain free mobility.    Baseline 06/25/20: 40    Time 4    Period Weeks    Status New    Target Date 07/23/20      PT LONG TERM GOAL #2   Title Pt. will report no increase in pain after weight lifting to improve pain free mobility.    Baseline Pt. typically feels pain 1 day after weight lifting.    Time 4    Period Weeks    Status New    Target Date 07/23/20      PT LONG TERM GOAL #3   Title Pt. will report no pain after 1.5 hours of standing to improve pain free mobility.    Baseline Pt. reports discomfort beginning after 1.5 hours of standing    Time 4    Period Weeks    Status New    Target Date 07/23/20                 Plan - 07/02/20 0832    Clinical Impression Statement Pt. able to progress TrA activation in supine and in standing. PT assessed pt's  workout form, cued for activating TrA in standing for upper body exercises and on [employer] for leg press form. Pt. instructed on proper plank form and regressing to starting on knees instead of full plank position. PT dry needled R lumbar paraspinals, pt reported concordant pain during needling. Pt. reported decrease in pain after needling. Pt. will continue to benefit from skilled PT to address TrA strength and endurance deficits to decrease back pain.    Stability/Clinical Decision Making Evolving/Moderate complexity    Clinical Decision Making Moderate    Rehab Potential Good    PT Frequency 2x / week    PT Duration 4 weeks    PT Treatment/Interventions ADLs/Self Care Home Management;Cryotherapy;Electrical Stimulation;Functional mobility training;Therapeutic activities;Therapeutic exercise;Neuromuscular re-education;Manual techniques;Patient/family education;Passive range of motion;Dry needling    PT Next Visit Plan core progression, discuss dry needling    PT Home Exercise Plan core progression    Consulted and Agree with Plan of Care  Patient           Patient will benefit from skilled therapeutic intervention in order to improve the following deficits and impairments:  Decreased activity tolerance, Decreased strength, Decreased endurance, Increased muscle spasms, Impaired flexibility, Improper body mechanics, Increased fascial restricitons  Visit Diagnosis: Chronic bilateral low back pain without sciatica  Muscle weakness (generalized)     Problem List There are no problems to display for this patient.  Cammie Mcgee, PT, DPT # [id number] Sharyn Creamer, SPT 07/02/2020, 8:59 AM   [facility] [address] [place], [place], [address] Phone: [phone]   Fax:  [phone]  Name: Kevin Larson MRN: [id number] Date of Birth: 01-02-1981

## 2020-07-05 ENCOUNTER — Encounter: Payer: Medicaid Other | Admitting: Physical Therapy

## 2020-07-09 ENCOUNTER — Encounter: Payer: Medicaid Other | Admitting: Physical Therapy

## 2020-07-12 ENCOUNTER — Ambulatory Visit: Payer: Medicaid Other

## 2020-07-12 ENCOUNTER — Other Ambulatory Visit: Payer: Self-pay

## 2020-07-12 ENCOUNTER — Encounter: Payer: Medicaid Other | Admitting: Physical Therapy

## 2020-07-12 ENCOUNTER — Encounter: Payer: Self-pay | Admitting: Physical Therapy

## 2020-07-12 DIAGNOSIS — M545 Low back pain: Secondary | ICD-10-CM | POA: Diagnosis not present

## 2020-07-12 DIAGNOSIS — M6281 Muscle weakness (generalized): Secondary | ICD-10-CM

## 2020-07-12 DIAGNOSIS — G8929 Other chronic pain: Secondary | ICD-10-CM

## 2020-07-12 NOTE — Therapy (Signed)
[facility] [address] [place], [place], [address] Phone: [phone]   Fax:  [phone]  Physical Therapy Treatment  Patient Details  Name: Kevin Larson MRN: [id number] Date of Birth: 1981/08/28 Referring Provider (PT): Karie Fetch, MD   Encounter Date: 07/12/2020   PT End of Session - 07/12/20 0839    Visit Number 3    Number of Visits 9    Date for PT Re-Evaluation 07/23/20    PT Start Time 0732    PT Stop Time 0816    PT Time Calculation (min) 44 min    Activity Tolerance Patient tolerated treatment well;No increased pain    Behavior During Therapy [facility] for tasks assessed/performed           History reviewed. No pertinent past medical history.  History reviewed. No pertinent surgical history.  There were no vitals filed for this visit.   Subjective Assessment - 07/12/20 0830    Subjective Pt. reports that he had some back pain yesterday up to 5/10, but is down to 1/10 toady. Pt. states difficulty with compliance to HEP.    Pertinent History 2 boys: 40 yo and 40 yo    Limitations Standing    How long can you stand comfortably? 1.5 hours    Patient Stated Goals decrease episodes of back pain    Currently in Pain? Yes    Pain Score 1     Pain Location Back            There ex:  Nustep 10 mins L3: Discussed HEP and subjective history from past week. Given education on importance of doing exercise with proper form   Supine TrA activation: pt required heavy verbal and tactile cueing. Pt had gait belt under pelvis as tactile cue to maintain posterior pelvic tilt.   Supine bridges with TrA activation: pt required heavy cueing to maintain TrA activation and to slow motion down.   Supine bridge marching with TrA activation: pt required heavy cueing to maintain TrA activation and to slow motion down.   Deadbugs: pt required heavy cueing to maintain TrA activation and to slow motion down.   Sitting on gray  physioball: Pt cued to sit on ball and palpate TrA himself. Pt able to activate it better in this position.  Planks on knees: Pt required cueing to attain proper position, able to maintain position well once in it. Pt educated that his "starting line" is how long he can hold the plank before he starts to feel his back muscles working.        PT Education - 07/12/20 0838    Education Details Pt given HEP for deadbugs, bridges, bridges with march, and plank on knees.    Person(s) Educated Patient    Methods Explanation;Demonstration;Handout    Comprehension Verbal cues required;Returned demonstration;Verbalized understanding               PT Long Term Goals - 06/25/20 1404      PT LONG TERM GOAL #1   Title Pt. will incresae FOTO score to 55 to improve pain free mobility.    Baseline 06/25/20: 40    Time 4    Period Weeks    Status New    Target Date 07/23/20      PT LONG TERM GOAL #2   Title Pt. will report no increase in pain after weight lifting to improve pain free mobility.    Baseline Pt. typically feels pain 1 day  after weight lifting.    Time 4    Period Weeks    Status New    Target Date 07/23/20      PT LONG TERM GOAL #3   Title Pt. will report no pain after 1.5 hours of standing to improve pain free mobility.    Baseline Pt. reports discomfort beginning after 1.5 hours of standing    Time 4    Period Weeks    Status New    Target Date 07/23/20                 Plan - 07/12/20 0842    Clinical Impression Statement Pt. continues to progress TrA activation in supnie and sitting. Pt. continues to demonstrate difficulty in understanding proper progression of exercise for core, and how much is too much. Pt. given HEP with specific instructions to complete before next session. Pt. instructed to complete gym exercises in sitting as much as possible to limit potential excessive spinal motion. Pt. will continue to benefit from skilled PT to improve TrA endurance  and decrease back pain.    Stability/Clinical Decision Making Evolving/Moderate complexity    Clinical Decision Making Moderate    Rehab Potential Good    PT Frequency 2x / week    PT Duration 4 weeks    PT Treatment/Interventions ADLs/Self Care Home Management;Cryotherapy;Electrical Stimulation;Functional mobility training;Therapeutic activities;Therapeutic exercise;Neuromuscular re-education;Manual techniques;Patient/family education;Passive range of motion;Dry needling    PT Next Visit Plan core exercises in sitting and standing    PT Home Exercise Plan core progression    Consulted and Agree with Plan of Care Patient           Patient will benefit from skilled therapeutic intervention in order to improve the following deficits and impairments:  Decreased activity tolerance, Decreased strength, Decreased endurance, Increased muscle spasms, Impaired flexibility, Improper body mechanics, Increased fascial restricitons  Visit Diagnosis: Chronic bilateral low back pain without sciatica  Muscle weakness (generalized)     Problem List There are no problems to display for this patient.   Sharyn Creamer, SPT 07/12/2020, 8:46 AM Max Fickle, PT, DPT Physical Therapist - [place]    [facility] [address] [place], [place], [address] Phone: [phone]   Fax:  [phone]  Name: Jabreel Chimento MRN: [id number] Date of Birth: Jan 14, 1981

## 2020-07-16 ENCOUNTER — Encounter: Payer: Medicaid Other | Admitting: Physical Therapy

## 2020-07-18 ENCOUNTER — Other Ambulatory Visit: Payer: Self-pay

## 2020-07-18 ENCOUNTER — Encounter: Payer: Self-pay | Admitting: Physical Therapy

## 2020-07-18 ENCOUNTER — Ambulatory Visit: Payer: Medicaid Other | Attending: Family Medicine | Admitting: Physical Therapy

## 2020-07-18 DIAGNOSIS — M545 Low back pain: Secondary | ICD-10-CM | POA: Insufficient documentation

## 2020-07-18 DIAGNOSIS — G8929 Other chronic pain: Secondary | ICD-10-CM | POA: Insufficient documentation

## 2020-07-18 DIAGNOSIS — M6281 Muscle weakness (generalized): Secondary | ICD-10-CM | POA: Insufficient documentation

## 2020-07-19 ENCOUNTER — Encounter: Payer: Medicaid Other | Admitting: Physical Therapy

## 2020-07-22 NOTE — Therapy (Signed)
[facility] [address] [place], [place], [address] Phone: [phone]   Fax:  [phone]  Physical Therapy Treatment  Patient Details  Name: Kevin Larson MRN: [id number] Date of Birth: 03-Nov-1981 Referring Provider (PT): Karie Fetch, MD   Encounter Date: 07/18/2020    Treatment: 4 of 9.  Recert date: 07/23/2020 3532 to 9924    History reviewed. No pertinent past medical history.  History reviewed. No pertinent surgical history.  There were no vitals filed for this visit.      Pt. reports no pain in low back pain. Pt. has been completing planks and states he is getting better/ more aware of core muscle activation.       There ex:  Supine LE/ lumbar generalized stretches (hamstring/gastroc/ piriformis/ rotn.)- 3x each  Supine TrA activation:  Supine ball knee to chest/ added bridging (cuing to slow down/ maintain core hold).  Planks on knees: Pt required cueing to attain proper position and instructed to decrease hold time/ increase reps at home.    Reviewed/ discussed HEP in depth.   Nustep 10 mins L3 B UE/LE (consistent cadence).  Given education on importance of doing exercise with proper form      PT Long Term Goals - 06/25/20 1404      PT LONG TERM GOAL #1   Title Pt. will incresae FOTO score to 55 to improve pain free mobility.    Baseline 06/25/20: 40    Time 4    Period Weeks    Status New    Target Date 07/23/20      PT LONG TERM GOAL #2   Title Pt. will report no increase in pain after weight lifting to improve pain free mobility.    Baseline Pt. typically feels pain 1 day after weight lifting.    Time 4    Period Weeks    Status New    Target Date 07/23/20      PT LONG TERM GOAL #3   Title Pt. will report no pain after 1.5 hours of standing to improve pain free mobility.    Baseline Pt. reports discomfort beginning after 1.5 hours of standing    Time 4    Period Weeks    Status New     Target Date 07/23/20                 Plan - 07/22/20 1947    Clinical Impression Statement Pt. requires less verbal/tactile cuing during progression of ther.ex./ TrA ex. program.  Good technique with elbow planks with cuing to decrease hold time and increase reps. to prevent loss of proper technique.  B hamstring/ gastroc muscle tightness and instructed to continue with current stretches/ core based program.  No changes to current ther.ex. program.    Stability/Clinical Decision Making Evolving/Moderate complexity    Clinical Decision Making Moderate    Rehab Potential Good    PT Frequency 2x / week    PT Duration 4 weeks    PT Treatment/Interventions ADLs/Self Care Home Management;Cryotherapy;Electrical Stimulation;Functional mobility training;Therapeutic activities;Therapeutic exercise;Neuromuscular re-education;Manual techniques;Patient/family education;Passive range of motion;Dry needling    PT Next Visit Plan core exercises in sitting and standing.   RECERT NEXT TX    PT Home Exercise Plan core progression    Consulted and Agree with Plan of Care Patient           Patient will benefit from skilled therapeutic intervention in order to improve the following deficits and impairments:  Decreased activity tolerance, Decreased strength, Decreased endurance, Increased muscle spasms, Impaired flexibility, Improper body mechanics, Increased fascial restricitons  Visit Diagnosis: Chronic bilateral low back pain without sciatica  Muscle weakness (generalized)     Problem List There are no problems to display for this patient.  Cammie Mcgee, PT, DPT # [id number] 07/22/2020, 7:53 PM  [facility] [address] [place], [place], [address] Phone: [phone]   Fax:  [phone]  Name: Kevin Larson MRN: [id number] Date of Birth: May 01, 1981

## 2020-07-23 ENCOUNTER — Encounter: Payer: Medicaid Other | Admitting: Physical Therapy

## 2020-07-26 ENCOUNTER — Encounter: Payer: Self-pay | Admitting: Physical Therapy

## 2020-07-26 ENCOUNTER — Other Ambulatory Visit: Payer: Self-pay

## 2020-07-26 ENCOUNTER — Ambulatory Visit: Payer: Medicaid Other | Admitting: Physical Therapy

## 2020-07-26 ENCOUNTER — Encounter: Payer: Medicaid Other | Admitting: Physical Therapy

## 2020-07-26 DIAGNOSIS — M6281 Muscle weakness (generalized): Secondary | ICD-10-CM

## 2020-07-26 DIAGNOSIS — G8929 Other chronic pain: Secondary | ICD-10-CM

## 2020-07-26 DIAGNOSIS — M545 Low back pain: Secondary | ICD-10-CM | POA: Diagnosis not present

## 2020-07-26 NOTE — Therapy (Signed)
[facility] [address] [place], [place], [address] Phone: [phone]   Fax:  [phone]  Physical Therapy Treatment  Patient Details  Name: Kevin Larson MRN: [id number] Date of Birth: 10-Jul-1981 Referring Provider (PT): Tomasa Hose, MD   Encounter Date: 07/26/2020   PT End of Session - 07/26/20 0814    Visit Number 5    Number of Visits 9    Date for PT Re-Evaluation 08/23/20    PT Start Time 0731    PT Stop Time 0815    PT Time Calculation (min) 44 min    Activity Tolerance Patient tolerated treatment well;No increased pain    Behavior During Therapy [facility] for tasks assessed/performed           History reviewed. No pertinent past medical history.  History reviewed. No pertinent surgical history.  There were no vitals filed for this visit.   Subjective Assessment - 07/26/20 0735    Subjective Pt. states that he wakes up with headaches and that he has restarted weight loss drugs.    Pertinent History 2 boys: 40 yo and 40 yo    Limitations Standing    How long can you stand comfortably? 1.5 hours    Patient Stated Goals decrease episodes of back pain    Currently in Pain? No/denies             [facility] PT Assessment - 07/26/20 0001      Assessment   Medical Diagnosis back pain    Referring Provider (PT) Tomasa Hose, MD    Onset Date/Surgical Date 12/16/19      Home Environment   Living Environment Private residence      Prior Function   Level of Independence Independent            There ex:  Nustep 10 mins  Paloff Press GTB: 10x each direction  Seated bicep curls: focus on form to decrease back movement.    Pt heavily educated today on proper weightlifting progression, proper weight lifting form, and importance of resistance training to overall health.   Goal reassessment     PT Long Term Goals - 07/26/20 0742      PT LONG TERM GOAL #1   Title Pt. will incresae FOTO score to 55 to improve pain  free mobility.    Baseline 06/25/20: 40. 8/12: 70    Time 4    Period Weeks    Status Achieved    Target Date 07/26/20      PT LONG TERM GOAL #2   Title Pt. will report no increase in pain after weight lifting to improve pain free mobility.    Baseline Pt. typically feels pain 1 day after weight lifting.; 8/12: pt. states pain is not entirely gone, but it is getting better.    Time 4    Period Weeks    Status Partially Met    Target Date 08/23/20      PT LONG TERM GOAL #3   Title Pt. will report no pain after 1.5 hours of standing to improve pain free mobility.    Baseline Pt. reports discomfort beginning after 1.5 hours of standing. 8/12: pt states he doesn't notice it after standing as much    Time 4    Period Weeks    Status Partially Met    Target Date 08/23/20                 Plan - 07/26/20 6962  Clinical Impression Statement Pt. demonstrates good return on core strength and recruitment with exercises. Pt. continues to demonstrate poor form with exercises in going too quickly throughout exercises. Pt. heavily educated today on proper weightlifting techniqe, proper weightlifting progression, and how often to exercises. Pt. will continue to benefit from skilled PT to decrease back pain and improve pain free functional mobility.    Stability/Clinical Decision Making Evolving/Moderate complexity    Clinical Decision Making Moderate    Rehab Potential Good    PT Frequency 1x / week    PT Duration 4 weeks    PT Treatment/Interventions ADLs/Self Care Home Management;Cryotherapy;Electrical Stimulation;Functional mobility training;Therapeutic activities;Therapeutic exercise;Neuromuscular re-education;Manual techniques;Patient/family education;Passive range of motion;Dry needling    PT Next Visit Plan Core exercises in sitting and standing.    PT Home Exercise Plan core progression    Consulted and Agree with Plan of Care Patient           Patient will benefit from  skilled therapeutic intervention in order to improve the following deficits and impairments:  Decreased activity tolerance, Decreased strength, Decreased endurance, Increased muscle spasms, Impaired flexibility, Improper body mechanics, Increased fascial restricitons  Visit Diagnosis: Chronic bilateral low back pain without sciatica  Muscle weakness (generalized)     Problem List There are no problems to display for this patient.  Pura Spice, PT, DPT # [id number] Carlyle Basques, SPT 07/26/2020, 3:31 PM  [facility] [address] [place], [place], [address] Phone: [phone]   Fax:  [phone]  Name: Kevin Larson MRN: [id number] Date of Birth: 1980-12-23

## 2020-07-30 ENCOUNTER — Encounter: Payer: Medicaid Other | Admitting: Physical Therapy

## 2020-08-02 ENCOUNTER — Ambulatory Visit: Payer: Medicaid Other | Admitting: Physical Therapy

## 2020-08-02 ENCOUNTER — Encounter: Payer: Self-pay | Admitting: Physical Therapy

## 2020-08-02 ENCOUNTER — Encounter: Payer: Medicaid Other | Admitting: Physical Therapy

## 2020-08-02 DIAGNOSIS — M545 Low back pain, unspecified: Secondary | ICD-10-CM

## 2020-08-02 DIAGNOSIS — G8929 Other chronic pain: Secondary | ICD-10-CM

## 2020-08-02 DIAGNOSIS — M6281 Muscle weakness (generalized): Secondary | ICD-10-CM

## 2020-08-02 NOTE — Therapy (Signed)
[facility] [address] [place], [place], [address] Phone: [phone]   Fax:  [phone]  Physical Therapy Treatment  Patient Details  Name: Jupiter Kabir MRN: [id number] Date of Birth: 16-Oct-1981 Referring Provider (PT): Tomasa Hose, MD   Encounter Date: 08/02/2020   PT End of Session - 08/02/20 0816    Visit Number 6    Number of Visits 9    Date for PT Re-Evaluation 08/23/20    PT Start Time 0811    PT Stop Time 0904    PT Time Calculation (min) 53 min    Activity Tolerance Patient tolerated treatment well;No increased pain    Behavior During Therapy [facility] for tasks assessed/performed           History reviewed. No pertinent past medical history.  History reviewed. No pertinent surgical history.  There were no vitals filed for this visit.   Subjective Assessment - 08/02/20 0814    Subjective Pt. states that he will restart gym membership starting September, back has been feeling okay.    Pertinent History 2 boys: 40 yo and 40 yo    Limitations Standing    How long can you stand comfortably? 1.5 hours    Patient Stated Goals decrease episodes of back pain    Currently in Pain? No/denies             There Ex:  Nustep L4 10 mins  Seated blue theraball weighted ball tosses: ball tosses with various reaches/tosses outside BOS to improve core strength. 10 mins.  Nautilus:   Triceps: 50lbs double arm. 12x  Lat pulldown: 50lbs double arm. 12x  Horizontal adduction: 50lbs double arm. 12x  Chest press: 50lbs double arm. 12x  Scap retraction: 50lbs double arm. 12x   Paloff press: 50 lbs single arm. 10x each direction    Paloff press side lunges: 50 lbs single arm. 3x down ladder each way   Trunk rotation: 10x each direction. 50lbs single arm  Chest X pattern: 10x. 40lbs double arm.      PT Long Term Goals - 07/26/20 0742      PT LONG TERM GOAL #1   Title Pt. will incresae FOTO score to 55 to improve  pain free mobility.    Baseline 06/25/20: 40. 8/12: 70    Time 4    Period Weeks    Status Achieved    Target Date 07/26/20      PT LONG TERM GOAL #2   Title Pt. will report no increase in pain after weight lifting to improve pain free mobility.    Baseline Pt. typically feels pain 1 day after weight lifting.; 8/12: pt. states pain is not entirely gone, but it is getting better.    Time 4    Period Weeks    Status Partially Met    Target Date 08/23/20      PT LONG TERM GOAL #3   Title Pt. will report no pain after 1.5 hours of standing to improve pain free mobility.    Baseline Pt. reports discomfort beginning after 1.5 hours of standing. 8/12: pt states he doesn't notice it after standing as much    Time 4    Period Weeks    Status Partially Met    Target Date 08/23/20                 Plan - 08/02/20 0859    Clinical Impression Statement Pt. taken through workout routine in session  today, see therapy note for details. Focus was mostly on correct form, pt required heavy verbal cueing to slow down every exercise. With slowed down form, pt able to perform exercises correctly. Pt. will continue to benefit from skilled PT to improve pain free functional mobility.    Stability/Clinical Decision Making Evolving/Moderate complexity    Clinical Decision Making Moderate    Rehab Potential Good    PT Frequency 1x / week    PT Duration 4 weeks    PT Treatment/Interventions ADLs/Self Care Home Management;Cryotherapy;Electrical Stimulation;Functional mobility training;Therapeutic activities;Therapeutic exercise;Neuromuscular re-education;Manual techniques;Patient/family education;Passive range of motion;Dry needling    PT Next Visit Plan Core exercises in sitting and standing.    PT Home Exercise Plan core progression    Consulted and Agree with Plan of Care Patient           Patient will benefit from skilled therapeutic intervention in order to improve the following deficits and  impairments:  Decreased activity tolerance, Decreased strength, Decreased endurance, Increased muscle spasms, Impaired flexibility, Improper body mechanics, Increased fascial restricitons  Visit Diagnosis: Chronic bilateral low back pain without sciatica  Muscle weakness (generalized)     Problem List There are no problems to display for this patient.  Pura Spice, PT, DPT # [id number] Carlyle Basques, SPT 08/02/2020, 1:26 PM  [facility] [address] [place], [place], [address] Phone: [phone]   Fax:  [phone]  Name: Damian Buckles MRN: [id number] Date of Birth: [birth year]

## 2020-08-09 ENCOUNTER — Ambulatory Visit: Payer: Medicaid Other | Admitting: Physical Therapy

## 2020-08-09 ENCOUNTER — Other Ambulatory Visit: Payer: Self-pay

## 2020-08-09 ENCOUNTER — Encounter: Payer: Self-pay | Admitting: Physical Therapy

## 2020-08-09 DIAGNOSIS — M545 Low back pain, unspecified: Secondary | ICD-10-CM

## 2020-08-09 DIAGNOSIS — M6281 Muscle weakness (generalized): Secondary | ICD-10-CM

## 2020-08-09 NOTE — Therapy (Signed)
[place] [facility] [address] [place], [place], [address] Phone: [phone]   Fax:  [phone]  Physical Therapy Treatment  Patient Details  Name: Kevin Larson MRN: [id number] Date of Birth: 01-30-1981 Referring Provider (PT): Tomasa Hose, MD   Encounter Date: 08/09/2020   PT End of Session - 08/09/20 0828    Visit Number 7    Number of Visits 9    Date for PT Re-Evaluation 08/23/20    PT Start Time 0820    PT Stop Time 0903    PT Time Calculation (min) 43 min    Activity Tolerance Patient tolerated treatment well;No increased pain    Behavior During Therapy [facility] for tasks assessed/performed           History reviewed. No pertinent past medical history.  History reviewed. No pertinent surgical history.  There were no vitals filed for this visit.   Subjective Assessment - 08/09/20 4268    Subjective Starting membership at [employer] in September. Pt unsure if he is not feeling his LBP because he is not lifting weights or HEP is working. Pt reports 0/10 pain NPS.    Pertinent History 2 boys: 40 yo and 40 yo    Limitations Standing    How long can you stand comfortably? 1.5 hours    Patient Stated Goals decrease episodes of back pain    Currently in Pain? No/denies    Pain Score 0-No pain          There.ex:   Sci-Fit L20. Discussion of HEP during. Use of lumbar support or towel roll when driving for improved posture. Education on sitting at home in seats with more back support instead of the couch in order to reduce slumped posture and sacral sitting.   Planks: 3x40 sec. PVC pipe on sacrum for form Supine B 6" foot lift for core activation: 2x10  Supine B 6" foot lift with hip abduction for core activation: 2x10 Push ups: 1x10, with reports of LBP at rep 8.  Wall push-ups: Verbal cues for form/tehcnique. No LBP reported with wall push ups   Nautilus exercises:   Lat pull-down:    1x15, 40 lbs. Slumped sitting. Lumbar  towel roll placed to improve posture. Reinforced education on proper posture and form .    2x15, 60 lbs. Improved form with towel roll in lumbar spine.    PT Education - 08/09/20 0826    Education Details Towel roll under lumbar spine when driving for improved posture.    Person(s) Educated Patient    Methods Explanation;Demonstration    Comprehension Verbalized understanding               PT Long Term Goals - 07/26/20 0742      PT LONG TERM GOAL #1   Title Pt. will incresae FOTO score to 55 to improve pain free mobility.    Baseline 06/25/20: 40. 8/12: 70    Time 4    Period Weeks    Status Achieved    Target Date 07/26/20      PT LONG TERM GOAL #2   Title Pt. will report no increase in pain after weight lifting to improve pain free mobility.    Baseline Pt. typically feels pain 1 day after weight lifting.; 8/12: pt. states pain is not entirely gone, but it is getting better.    Time 4    Period Weeks    Status Partially Met    Target Date 08/23/20  PT LONG TERM GOAL #3   Title Pt. will report no pain after 1.5 hours of standing to improve pain free mobility.    Baseline Pt. reports discomfort beginning after 1.5 hours of standing. 8/12: pt states he doesn't notice it after standing as much    Time 4    Period Weeks    Status Partially Met    Target Date 08/23/20                 Plan - 08/09/20 1005    Clinical Impression Statement Pt educated thoroughly on proper posture and technique with exercises to reduce LBP with exercise that pt has had in the past. Education on use of towel roll on lumbar spine as tactile cue for seated tasks and exercises to reduce slumped sitting. Pt reports LBP with standard push-up. After verbal cues and perfoming wall push-ups pt had no LBP. Pt can continue to benefit from skilled PT to educate on form/tehcnique with exercise to reduce LBP with recreational activities.    Stability/Clinical Decision Making Evolving/Moderate  complexity    Rehab Potential Good    PT Frequency 1x / week    PT Duration 4 weeks    PT Treatment/Interventions ADLs/Self Care Home Management;Cryotherapy;Electrical Stimulation;Functional mobility training;Therapeutic activities;Therapeutic exercise;Neuromuscular re-education;Manual techniques;Patient/family education;Passive range of motion;Dry needling    PT Next Visit Plan Core progression in plank position, dynamic resistance exercises with good form.    PT Home Exercise Plan core progression    Consulted and Agree with Plan of Care Patient           Patient will benefit from skilled therapeutic intervention in order to improve the following deficits and impairments:  Decreased activity tolerance, Decreased strength, Decreased endurance, Increased muscle spasms, Impaired flexibility, Improper body mechanics, Increased fascial restricitons  Visit Diagnosis: Chronic bilateral low back pain without sciatica  Muscle weakness (generalized)     Problem List There are no problems to display for this patient.  Pura Spice, PT, DPT # [id number] Larna Daughters, SPT 08/09/2020, 1:27 PM  [facility] [address] [place], [place], [address] Phone: [phone]   Fax:  [phone]  Name: Kevin Larson MRN: [id number] Date of Birth: 1981-06-12

## 2020-08-16 ENCOUNTER — Ambulatory Visit: Payer: Medicaid Other | Attending: Family Medicine | Admitting: Physical Therapy

## 2020-08-23 ENCOUNTER — Ambulatory Visit: Payer: Medicaid Other | Admitting: Physical Therapy

## 2024-03-15 ENCOUNTER — Telehealth: Payer: Self-pay | Admitting: Emergency Medicine

## 2024-03-15 ENCOUNTER — Ambulatory Visit
Admission: EM | Admit: 2024-03-15 | Discharge: 2024-03-15 | Disposition: A | Discharge status: Home / Self Care | Attending: Emergency Medicine | Admitting: Emergency Medicine

## 2024-03-15 ENCOUNTER — Encounter: Payer: Self-pay | Admitting: Emergency Medicine

## 2024-03-15 DIAGNOSIS — S39012A Strain of muscle, fascia and tendon of lower back, initial encounter: Secondary | ICD-10-CM

## 2024-03-15 DIAGNOSIS — M62830 Muscle spasm of back: Secondary | ICD-10-CM | POA: Diagnosis not present

## 2024-03-15 MED ORDER — IBUPROFEN 600 MG PO TABS: 600.0000 mg | ORAL_TABLET | Freq: Four times a day (QID) | ORAL | 0 refills | Status: AC | PRN

## 2024-03-15 MED ORDER — KETOROLAC TROMETHAMINE 30 MG/ML IJ SOLN
30.0000 mg | Freq: Once | INTRAMUSCULAR | Status: AC
  Administered 2024-03-15: 30 mg via INTRAMUSCULAR

## 2024-03-15 MED ORDER — METAXALONE 800 MG PO TABS: 800.0000 mg | ORAL_TABLET | Freq: Three times a day (TID) | ORAL | 0 refills | Status: DC

## 2024-03-15 MED ORDER — METHYLPREDNISOLONE 4 MG PO TBPK: ORAL_TABLET | Freq: Every day | ORAL | 0 refills | Status: AC

## 2024-03-15 MED ORDER — ACETAMINOPHEN 325 MG PO TABS
975.0000 mg | ORAL_TABLET | Freq: Once | ORAL | Status: AC
  Administered 2024-03-15: 975 mg via ORAL

## 2024-03-15 MED ORDER — TIZANIDINE HCL 4 MG PO TABS: 4.0000 mg | ORAL_TABLET | Freq: Three times a day (TID) | ORAL | 0 refills | Status: AC | PRN

## 2024-03-15 MED ORDER — DEXAMETHASONE SODIUM PHOSPHATE 10 MG/ML IJ SOLN
10.0000 mg | Freq: Once | INTRAMUSCULAR | Status: AC
  Administered 2024-03-15: 10 mg via INTRAMUSCULAR

## 2024-03-15 NOTE — Discharge Instructions (Signed)
 600 mg of ibuprofen combined with 1000 mg of tylenol 3 or 4 times a day.   Try the muscle relaxant.  Heat has been shown to be beneficial.  Many people also find gentle stretching and deep tissue massage helpful.  If the Skelaxin is too expensive, then let us know and we can call in a prescription of Zanaflex.  Go to www.goodrx.com  or www.costplusdrugs.com to look up your medications. This will give you a list of where you can find your prescriptions at the most affordable prices. Or ask the pharmacist what the cash price is, or if they have any other discount programs available to help make your medication more affordable. This can be less expensive than what you would pay with insurance.

## 2024-03-15 NOTE — ED Triage Notes (Signed)
 Pt presents with lower back pain that started last night. Pt has taken ibuprofen for the pain.

## 2024-03-15 NOTE — ED Provider Notes (Addendum)
 HPI  SUBJECTIVE:  Kevin Larson is a 43 y.o. male who presents with the acute onset of midline low back pain, worse on the left after doing a heavy workout, resting and then getting up from the couch.  He describes to the pain as sharp, getting worse and becoming constant today.  Denies direct trauma, N/V, fevers, flank pain, abdominal pain, urinary urgency, frequency, dysuria, cloudy or odorous urine, hematuria.  No syncope. No saddle anesthesia, distal weakness/numbness, bilateral radicular leg pain/weakness,   bladder/ bowel incontinence, urinary retention. no h/o  nephrolithiasis. States feels similar to previous episodes of back pain.  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  [facility]  History reviewed. No pertinent past medical history.  History reviewed. No pertinent surgical history.  History reviewed. No pertinent family history.  Social History   Tobacco Use   Smoking status: Never   Smokeless tobacco: Never  Vaping Use   Vaping status: Never Used  Substance Use Topics   Alcohol use: No   Drug use: Never    No current facility-administered medications for this encounter.  Current Outpatient Medications:    ibuprofen (ADVIL) 600 MG tablet, Take 1 tablet (600 mg total) by mouth every 6 (six) hours as needed., Disp: 30 tablet, Rfl: 0   metaxalone (SKELAXIN) 800 MG tablet, Take 1 tablet (800 mg total) by mouth 3 (three) times daily. Take on an empty stomach, Disp: 30 tablet, Rfl: 0   methylPREDNISolone (MEDROL DOSEPAK) 4 MG TBPK tablet, Take by mouth daily. Follow package instructions, Disp: 21 tablet, Rfl: 0   traZODone (DESYREL) 50 MG tablet, Take by mouth., Disp: , Rfl:    sildenafil (VIAGRA) 50 MG tablet, Take by mouth., Disp: , Rfl:   Allergies  Allergen Reactions   Bupropion Other (See Comments)     ROS  As noted in HPI.   Physical Exam  BP (!) 151/98 (BP Location: Right Arm)   Pulse 63   Temp (!)  97.4 F (36.3 C) (Tympanic)   Resp 18   SpO2 99%   Constitutional: Well developed, well nourished, appears uncomfortable, pacing around the room.  Pain clearly aggravated with large positional changes, torso movement. Eyes:  EOMI, conjunctiva normal bilaterally HENT: Normocephalic, atraumatic,mucus membranes moist Respiratory: Normal inspiratory effort Cardiovascular: Normal rate GI: nondistended. No suprapubic, flank tenderness skin: No rash, skin intact Musculoskeletal: no CVAT.  Bilateral paralumbar tenderness, positive muscle spasm. No L-spine, SI joint, sacral bony tenderness. Bilateral lower extremities nontender, baseline ROM with intact DP  pulses.  Sensation between thighs intact.  Pain with bilateral hip flexion against resistance, worse in the left.  no pain with passive int/ext rotation, extension hips, AD/ABduction bilaterally. SLR neg bilaterally. Sensation baseline light touch bilaterally for Pt, DTR's symmetric and intact bilaterally KJ, Motor symmetric bilateral 5/5 hip flexion, quadriceps, hamstrings, EHL, foot dorsiflexion, foot plantarflexion, gait somewhat antalgic but without apparent new ataxia. Neurologic: Alert & oriented x 3, no focal neuro deficits Psychiatric: Speech and behavior appropriate   ED Course   Medications  acetaminophen (TYLENOL) tablet 975 mg (975 mg Oral Given 03/15/24 1343)  ketorolac (TORADOL) 30 MG/ML injection 30 mg (30 mg Intramuscular Given 03/15/24 1342)  dexamethasone (DECADRON) injection 10 mg (10 mg Intramuscular Given 03/15/24 1342)    Orders Placed This Encounter  Procedures   AMB referral to sports medicine    Referral Priority:   Routine    Referral Type:   Consultation    Referred  to Provider:   Jerrol Banana, MD    Number of Visits Requested:   1    No results found for this or any previous visit (from the past 24 hours). No results found.  ED Clinical Impression  1. Strain of lumbar region, initial encounter   2. Muscle  spasm of back     ED Assessment/Plan   No evidence of spinal cord involvement based on H&P. Pt describing typical back pain, has been < 6 week duration. No historical red flags as noted in HPI. No physical red flags such as fever, bony tenderness, lower extremity weakness, saddle anesthesia. Imaging not indicated at this time.   Giving dexamethasone 10 mg IM, Toradol 30 mg IM, Tylenol 975 mg p.o.  Home with heat, stretching, Medrol Dosepak, Skelaxin, ibuprofen/Tylenol.  Will refer to Dr. Joseph Berkshire, sports medicine, if not better in a week to 10 days.  ER return precautions given.  Skelaxin not covered/too expensive. Rx'ing zanaflex to pharmacy on record.  Discussed signs and symptoms that should prompt return to the emergency department.  Patient agrees with plan.   Meds ordered this encounter  Medications   acetaminophen (TYLENOL) tablet 975 mg   ketorolac (TORADOL) 30 MG/ML injection 30 mg   dexamethasone (DECADRON) injection 10 mg   ibuprofen (ADVIL) 600 MG tablet    Sig: Take 1 tablet (600 mg total) by mouth every 6 (six) hours as needed.    Dispense:  30 tablet    Refill:  0   methylPREDNISolone (MEDROL DOSEPAK) 4 MG TBPK tablet    Sig: Take by mouth daily. Follow package instructions    Dispense:  21 tablet    Refill:  0   metaxalone (SKELAXIN) 800 MG tablet    Sig: Take 1 tablet (800 mg total) by mouth 3 (three) times daily. Take on an empty stomach    Dispense:  30 tablet    Refill:  0    *This clinic note was created using Scientist, clinical (histocompatibility and immunogenetics). Therefore, there may be occasional mistakes despite careful proofreading.  ?     Domenick Gong, MD 03/15/24 1626    Domenick Gong, MD 03/15/24 [phone]

## 2024-03-15 NOTE — Telephone Encounter (Signed)
 Skelaxin too expensive/not covered by Medicaid.  Will prescribe Zanaflex to pharmacy on record.
